# Patient Record
Sex: Female | Born: 1955 | Race: White | Hispanic: No | Marital: Married | Smoking: Never smoker
Health system: Southern US, Community
[De-identification: ages and names within clinical notes are randomized; demographics above are authoritative.]

## PROBLEM LIST (undated history)

## (undated) DIAGNOSIS — N200 Calculus of kidney: Secondary | ICD-10-CM

## (undated) DIAGNOSIS — M1990 Unspecified osteoarthritis, unspecified site: Secondary | ICD-10-CM

## (undated) DIAGNOSIS — E049 Nontoxic goiter, unspecified: Secondary | ICD-10-CM

## (undated) DIAGNOSIS — F419 Anxiety disorder, unspecified: Secondary | ICD-10-CM

## (undated) DIAGNOSIS — F0390 Unspecified dementia without behavioral disturbance: Secondary | ICD-10-CM

## (undated) DIAGNOSIS — Z87898 Personal history of other specified conditions: Secondary | ICD-10-CM

## (undated) DIAGNOSIS — E7800 Pure hypercholesterolemia, unspecified: Secondary | ICD-10-CM

## (undated) DIAGNOSIS — R079 Chest pain, unspecified: Secondary | ICD-10-CM

## (undated) DIAGNOSIS — K219 Gastro-esophageal reflux disease without esophagitis: Secondary | ICD-10-CM

## (undated) DIAGNOSIS — Z6823 Body mass index (BMI) 23.0-23.9, adult: Secondary | ICD-10-CM

## (undated) DIAGNOSIS — R9431 Abnormal electrocardiogram [ECG] [EKG]: Secondary | ICD-10-CM

## (undated) HISTORY — DX: Chest pain, unspecified: R07.9

## (undated) HISTORY — DX: Unspecified osteoarthritis, unspecified site: M19.90

## (undated) HISTORY — PX: BREAST LUMPECTOMY: SHX2

## (undated) HISTORY — DX: Body mass index (BMI) 23.0-23.9, adult: Z68.23

## (undated) HISTORY — PX: TOTAL ABDOMINAL HYSTERECTOMY: SHX209

## (undated) HISTORY — DX: Gilbert syndrome: E80.4

## (undated) HISTORY — PX: UMBILICAL HERNIA REPAIR: SHX196

## (undated) HISTORY — DX: Pure hypercholesterolemia, unspecified: E78.00

## (undated) HISTORY — DX: Anxiety disorder, unspecified: F41.9

## (undated) HISTORY — DX: Gastro-esophageal reflux disease without esophagitis: K21.9

## (undated) HISTORY — DX: Unspecified dementia, unspecified severity, without behavioral disturbance, psychotic disturbance, mood disturbance, and anxiety: F03.90

## (undated) HISTORY — DX: Calculus of kidney: N20.0

## (undated) HISTORY — DX: Abnormal electrocardiogram (ECG) (EKG): R94.31

## (undated) HISTORY — DX: Nontoxic goiter, unspecified: E04.9

## (undated) HISTORY — PX: INGUINAL HERNIA REPAIR: SUR1180

## (undated) HISTORY — DX: Personal history of other specified conditions: Z87.898

## (undated) HISTORY — PX: CHOLECYSTECTOMY: SHX55

## (undated) HISTORY — PX: OTHER SURGICAL HISTORY: SHX169

## (undated) HISTORY — PX: LITHOTRIPSY: SUR834

---

## 1997-11-20 ENCOUNTER — Other Ambulatory Visit: Admission: RE | Admit: 1997-11-20 | Discharge: 1997-11-20 | Payer: Self-pay | Admitting: *Deleted

## 2005-03-30 ENCOUNTER — Encounter: Admission: RE | Admit: 2005-03-30 | Discharge: 2005-03-30 | Payer: Self-pay | Admitting: Gastroenterology

## 2011-01-11 DIAGNOSIS — H539 Unspecified visual disturbance: Secondary | ICD-10-CM

## 2011-01-11 DIAGNOSIS — R42 Dizziness and giddiness: Secondary | ICD-10-CM | POA: Insufficient documentation

## 2011-01-11 HISTORY — DX: Unspecified visual disturbance: H53.9

## 2011-01-11 HISTORY — DX: Dizziness and giddiness: R42

## 2012-02-15 DIAGNOSIS — G43009 Migraine without aura, not intractable, without status migrainosus: Secondary | ICD-10-CM

## 2012-02-15 DIAGNOSIS — R930 Abnormal findings on diagnostic imaging of skull and head, not elsewhere classified: Secondary | ICD-10-CM | POA: Insufficient documentation

## 2012-02-15 HISTORY — DX: Abnormal findings on diagnostic imaging of skull and head, not elsewhere classified: R93.0

## 2012-02-15 HISTORY — DX: Migraine without aura, not intractable, without status migrainosus: G43.009

## 2015-07-12 DIAGNOSIS — E785 Hyperlipidemia, unspecified: Secondary | ICD-10-CM | POA: Insufficient documentation

## 2015-07-12 DIAGNOSIS — R002 Palpitations: Secondary | ICD-10-CM

## 2015-07-12 DIAGNOSIS — R0789 Other chest pain: Secondary | ICD-10-CM

## 2015-07-12 DIAGNOSIS — R0609 Other forms of dyspnea: Secondary | ICD-10-CM | POA: Insufficient documentation

## 2015-07-12 HISTORY — DX: Hyperlipidemia, unspecified: E78.5

## 2015-07-12 HISTORY — DX: Other chest pain: R07.89

## 2015-07-12 HISTORY — DX: Palpitations: R00.2

## 2015-07-12 HISTORY — DX: Other forms of dyspnea: R06.09

## 2017-12-27 ENCOUNTER — Other Ambulatory Visit: Payer: Self-pay | Admitting: Family

## 2017-12-27 DIAGNOSIS — R1310 Dysphagia, unspecified: Secondary | ICD-10-CM

## 2017-12-30 ENCOUNTER — Ambulatory Visit
Admission: RE | Admit: 2017-12-30 | Discharge: 2017-12-30 | Disposition: A | Discharge status: Home / Self Care | Payer: Managed Care, Other (non HMO) | Source: Ambulatory Visit | Attending: Family | Admitting: Family

## 2017-12-30 ENCOUNTER — Other Ambulatory Visit: Payer: Self-pay | Admitting: Family

## 2017-12-30 DIAGNOSIS — M546 Pain in thoracic spine: Secondary | ICD-10-CM

## 2018-01-13 ENCOUNTER — Other Ambulatory Visit: Payer: Managed Care, Other (non HMO)

## 2018-01-13 ENCOUNTER — Ambulatory Visit
Admission: RE | Admit: 2018-01-13 | Discharge: 2018-01-13 | Disposition: A | Discharge status: Home / Self Care | Payer: Managed Care, Other (non HMO) | Source: Ambulatory Visit | Attending: Family | Admitting: Family

## 2018-01-13 DIAGNOSIS — R1310 Dysphagia, unspecified: Secondary | ICD-10-CM

## 2019-02-22 IMAGING — RF DG ESOPHAGUS
5 series · 14 of 17 positions shown · non-contrast
Comparison: None.

CLINICAL DATA: Dysphagia.

EXAM:
ESOPHOGRAM / BARIUM SWALLOW / BARIUM TABLET STUDY
TECHNIQUE: Combined double contrast and single contrast examination performed
using effervescent crystals, thick barium liquid, and thin barium
liquid. The patient was observed with fluoroscopy swallowing a 13 mm
barium sulphate tablet.
FLUOROSCOPY TIME:  Fluoroscopy Time:  1 minutes 30 seconds
Radiation Exposure Index (if provided by the fluoroscopic device):
16 mGy

[Series 1: sequence · 3 of 30 frames shown (1 of 3)]
[frame 5/30]
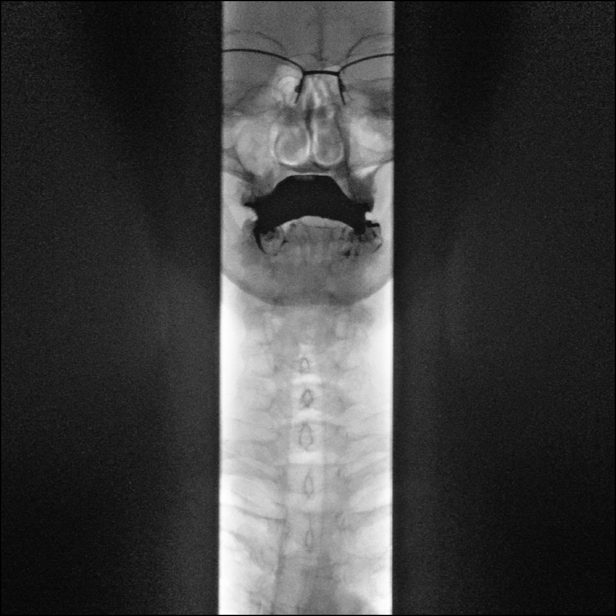
[frame 16/30]
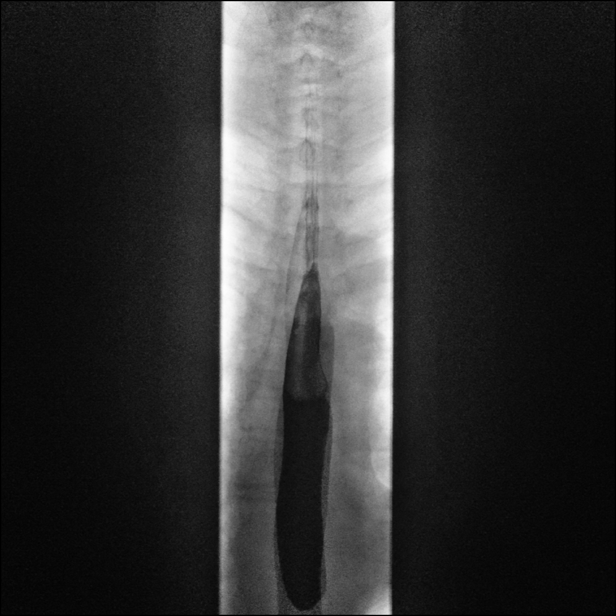
[frame 26/30]
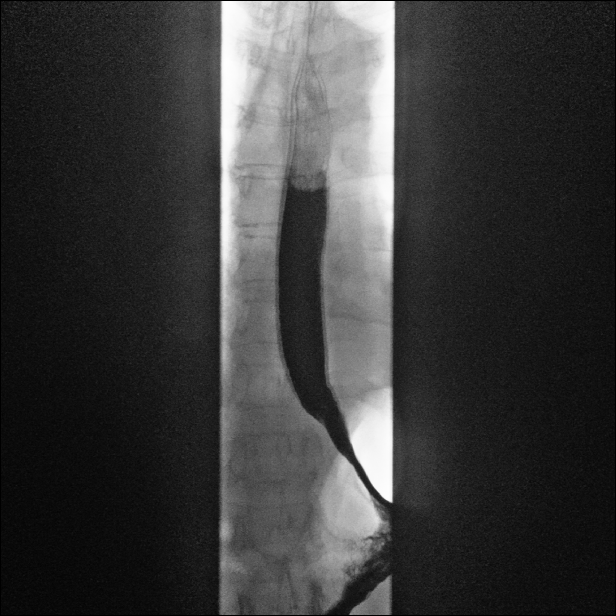

[Series 2: sequence · 4 of 19 frames shown (2 of 3)]
[frame 3/19]
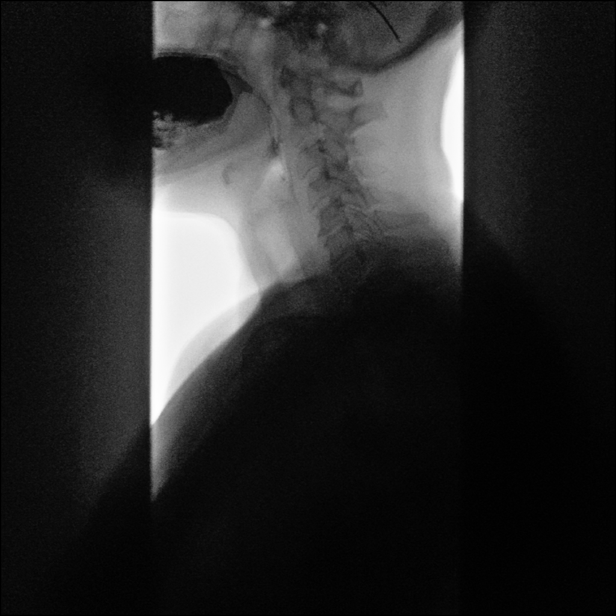
[frame 10/19]
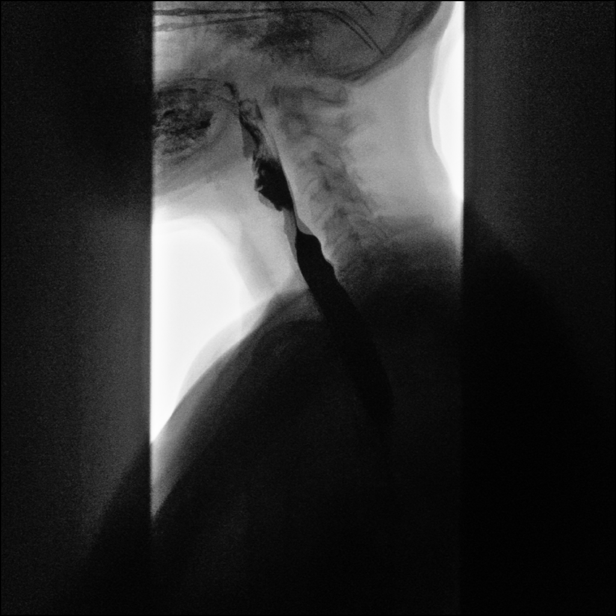
[frame 17/19]
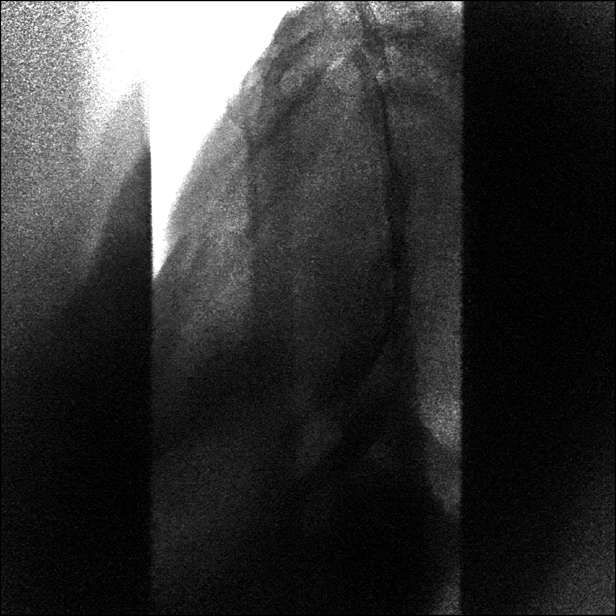
[frame 18/19  full-range]
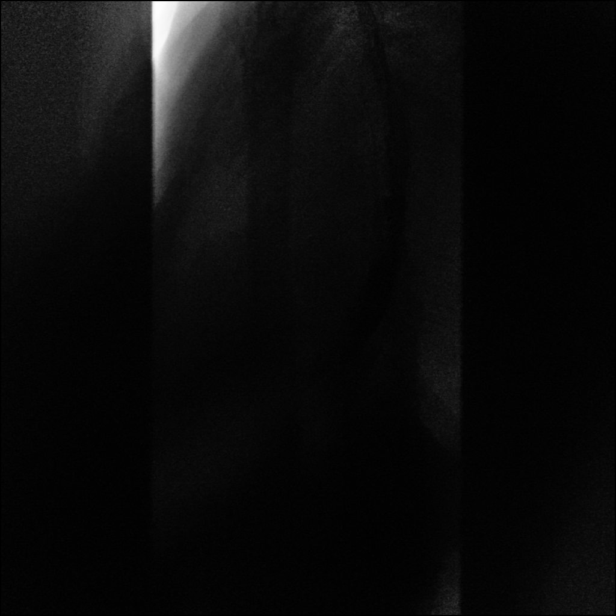

[Series 3: one shot · 0.14mm/px · 3 of 4 slices shown (1 of 2)]
[im 2/4]
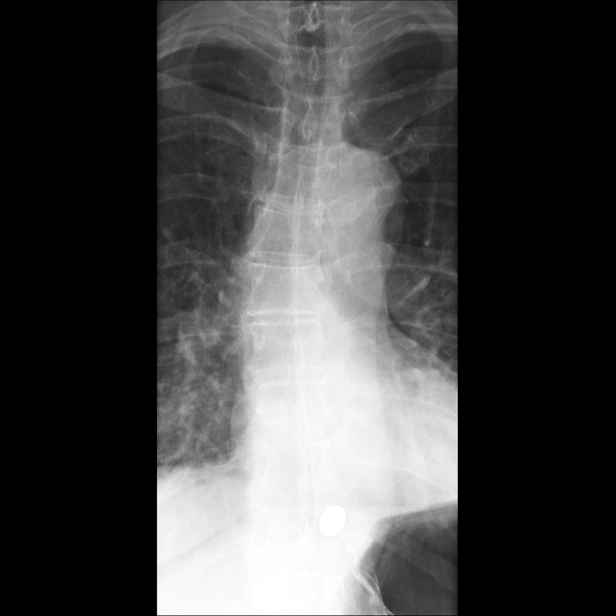
[im 3/4]
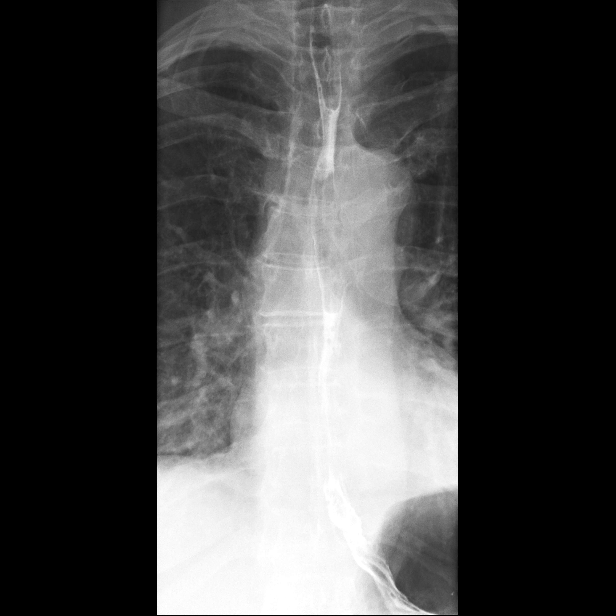
[im 4/4]
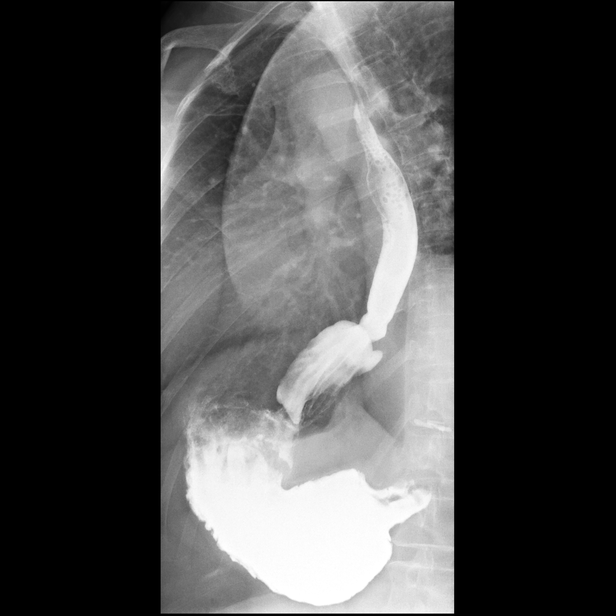

[Series 4: sequence · 3 of 18 frames shown (3 of 3)]
[frame 3/18]
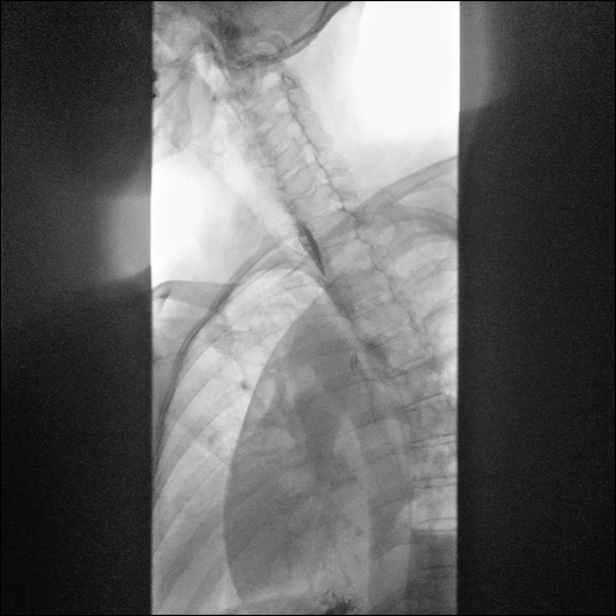
[frame 10/18]
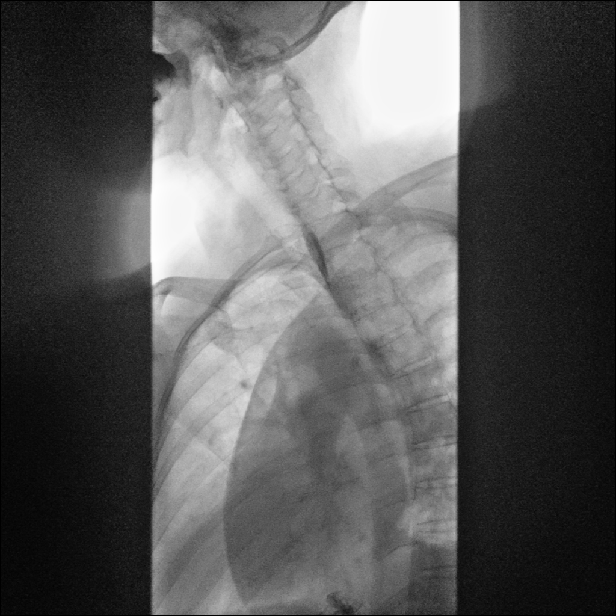
[frame 18/18]
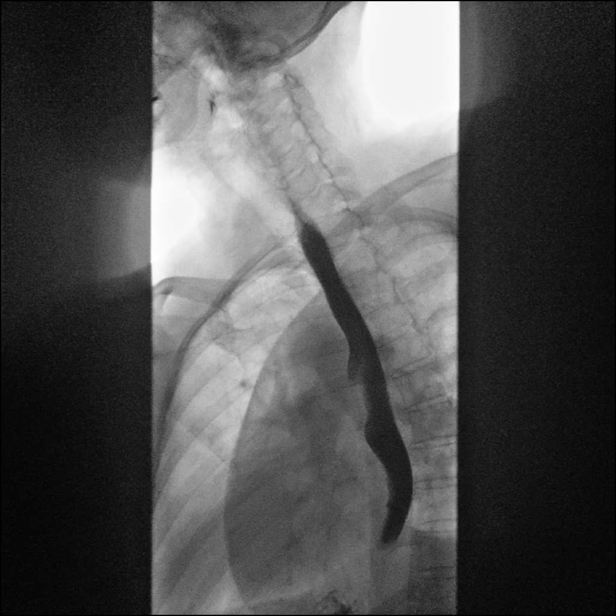

[Series 5: one shot · 0.14mm/px · 1 of 1 slices shown (2 of 2)]
[im 1/1]
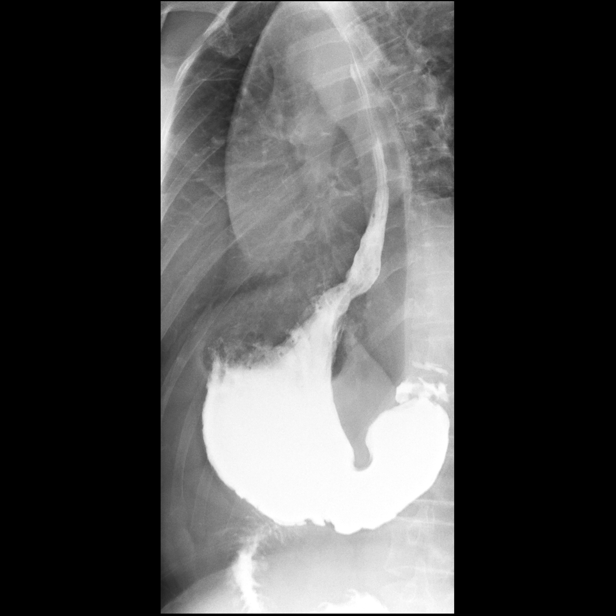

[14 of 17 positions shown; findings below may reference images not displayed]

FINDINGS: The oropharyngeal swallowing mechanisms are normal. The mucosa and
motility of the esophagus are normal. There is a 3 cm hiatal hernia
demonstrated on the prone oblique images.

The 13 mm barium tablet temporarily lodged at the gastroesophageal
junction but did pass into the stomach after repeated swallows of
water and barium.

No mass lesions.  No discrete esophagitis.
IMPRESSION: 3 cm sliding hiatal hernia. Spasm at the gastroesophageal junction
without a stricture. Otherwise, normal exam.

## 2019-12-15 DIAGNOSIS — R079 Chest pain, unspecified: Secondary | ICD-10-CM | POA: Diagnosis not present

## 2019-12-15 DIAGNOSIS — K219 Gastro-esophageal reflux disease without esophagitis: Secondary | ICD-10-CM | POA: Diagnosis not present

## 2019-12-15 DIAGNOSIS — E7800 Pure hypercholesterolemia, unspecified: Secondary | ICD-10-CM | POA: Diagnosis not present

## 2019-12-15 DIAGNOSIS — R0602 Shortness of breath: Secondary | ICD-10-CM | POA: Diagnosis not present

## 2019-12-16 DIAGNOSIS — R079 Chest pain, unspecified: Secondary | ICD-10-CM | POA: Diagnosis not present

## 2019-12-16 DIAGNOSIS — R0602 Shortness of breath: Secondary | ICD-10-CM | POA: Diagnosis not present

## 2019-12-21 DIAGNOSIS — R079 Chest pain, unspecified: Secondary | ICD-10-CM | POA: Diagnosis not present

## 2019-12-21 DIAGNOSIS — R0602 Shortness of breath: Secondary | ICD-10-CM | POA: Diagnosis not present

## 2019-12-21 DIAGNOSIS — E7800 Pure hypercholesterolemia, unspecified: Secondary | ICD-10-CM | POA: Diagnosis not present

## 2021-01-27 ENCOUNTER — Ambulatory Visit (INDEPENDENT_AMBULATORY_CARE_PROVIDER_SITE_OTHER): Payer: 59

## 2021-01-27 ENCOUNTER — Other Ambulatory Visit: Payer: Self-pay

## 2021-01-27 ENCOUNTER — Ambulatory Visit (INDEPENDENT_AMBULATORY_CARE_PROVIDER_SITE_OTHER): Payer: 59 | Admitting: Sports Medicine

## 2021-01-27 ENCOUNTER — Encounter: Payer: Self-pay | Admitting: Sports Medicine

## 2021-01-27 DIAGNOSIS — M7750 Other enthesopathy of unspecified foot: Secondary | ICD-10-CM

## 2021-01-27 DIAGNOSIS — M79671 Pain in right foot: Secondary | ICD-10-CM | POA: Diagnosis not present

## 2021-01-27 DIAGNOSIS — M7751 Other enthesopathy of right foot: Secondary | ICD-10-CM

## 2021-01-27 DIAGNOSIS — L603 Nail dystrophy: Secondary | ICD-10-CM | POA: Diagnosis not present

## 2021-01-27 MED ORDER — PREDNISONE 10 MG (21) PO TBPK: ORAL_TABLET | ORAL | 0 refills | Status: DC

## 2021-01-27 NOTE — Progress Notes (Signed)
Subjective: Samantha Mann is a 65 y.o. female patient who presents to office for evaluation of Right heel pain.  Patient reports that pain has slowly worsened since her beach trip 2 weeks ago states that it was very swollen on Saturday and states that she has not tried any treatment.  Patient also states that she is concerned about the discoloration at the tip of her right great toenail.  Patient denies any other pedal complaints at this time.  Patient is assisted by daughter this visit.  Patient Active Problem List   Diagnosis Date Noted   Atypical chest pain 07/12/2015   Dyslipidemia 07/12/2015   Dyspnea on exertion 07/12/2015   Palpitations 07/12/2015   Migraine without aura 02/15/2012   Nonspecific abnormal findings on radiological and examination of skull and head 02/15/2012   Dizziness 01/11/2011   Visual disturbances 01/11/2011    Current Outpatient Medications on File Prior to Visit  Medication Sig Dispense Refill   ALPRAZolam (XANAX) 0.25 MG tablet as needed.     baclofen (LIORESAL) 10 MG tablet TK 1 T PO  BID PRN P     divalproex (DEPAKOTE ER) 500 MG 24 hr tablet Take 1 tablet by mouth daily.     pravastatin (PRAVACHOL) 20 MG tablet TK 1 T PO QHS     aspirin 81 MG EC tablet 81 mg.     Cholecalciferol 25 MCG (1000 UT) capsule Take by mouth.     estrogen-methylTESTOSTERone 0.625-1.25 MG tablet Frequency:daily   Dosage:0.625-1.25   MG  Instructions:EEMT HS (0.625-1.25MG  TABS, 1 Oral daily)  Note:     Infant Care Products (DERMACLOUD) OINT Apply topically as directed.     PARoxetine (PAXIL) 10 MG tablet Take 10 mg by mouth daily.     ranitidine (ZANTAC) 300 MG capsule Take by mouth.     Vitamin E 45 MG (100 UNIT) CAPS Take by mouth.     No current facility-administered medications on file prior to visit.    Allergies  Allergen Reactions   Acetaminophen Nausea And Vomiting   Oxycodone-Acetaminophen Nausea And Vomiting    Feels like she is going crazy     Hydrocodone-Acetaminophen Anxiety    From Vicodin   Penicillins Rash    Objective:  General: Alert and oriented x3 in no acute distress  Dermatology: No open lesions bilateral lower extremities, no webspace macerations, no ecchymosis bilateral, all nails x 10 are well manicured however there is significant thickness and subungual debris at the distal aspect of the right hallux nail.  Vascular: Dorsalis Pedis and Posterior Tibial pedal pulses 1/4, Capillary Fill Time 3 seconds, + pedal hair growth bilateral, no edema bilateral lower extremities, Temperature gradient within normal limits.  Neurology: Michaell Cowing sensation intact via light touch bilateral.  Musculoskeletal: Mild tenderness with palpation at insertion of the Achilles on Right, there is calcaneal exostosis with mild soft tissue present and decreased ankle rom with knee extending  vs flexed resembling gastroc equnius bilateral, The achilles tendon feels intact with no nodularity or palpable dell, Thompson sign negative, Subtalar and midtarsal joint range of motion is within normal limits, there is limited first MPJ range of motion right greater than left.  Gait: Antalgic gait with increased heel off right right foot  Xrays  Right foot   Impression: Normal osseous mineralization. Joint spaces preserved. No fracture/dislocation/boney destruction. Calcaneal spur present. Kager's triangle intact with no obliteration. No soft tissue abnormalities or radiopaque foreign bodies.   Assessment and Plan: Problem List Items Addressed This Visit  None Visit Diagnoses     Tendonitis of ankle or foot    -  Primary   Relevant Orders   DG Foot Complete Right   Pain of right heel       Nail dystrophy       Relevant Orders   Culture, fungus without smear       -Complete examination performed -Xrays reviewed -Discussed treatement options for Achilles tendinitis on right -Dispensed cam boot and advised patient to wear as  directed -Dispensed Surgigrip compression sleeve to assist with edema control at the posterior heel -Advised patient to ice for 20 minutes once or twice daily -Rx prednisone for patient to take as directed -Fungal culture was obtained by removing a portion of the hard nail of the right great toenail using a sterile nail nipper and sent to [facility] lab. Patient tolerated the biopsy procedure well without discomfort or need for anesthesia.  -Patient to return to office 3 to 4 weeks or sooner if condition worsens.  Asencion Islam, DPM

## 2021-02-21 ENCOUNTER — Encounter: Payer: Self-pay | Admitting: Sports Medicine

## 2021-02-21 ENCOUNTER — Ambulatory Visit (INDEPENDENT_AMBULATORY_CARE_PROVIDER_SITE_OTHER): Payer: 59 | Admitting: Sports Medicine

## 2021-02-21 ENCOUNTER — Other Ambulatory Visit: Payer: Self-pay

## 2021-02-21 DIAGNOSIS — M7661 Achilles tendinitis, right leg: Secondary | ICD-10-CM | POA: Diagnosis not present

## 2021-02-21 DIAGNOSIS — M79671 Pain in right foot: Secondary | ICD-10-CM

## 2021-02-21 DIAGNOSIS — L603 Nail dystrophy: Secondary | ICD-10-CM

## 2021-02-21 NOTE — Progress Notes (Signed)
Subjective: Samantha Mann is a 65 y.o. female patient who returns to office for follow-up evaluation of right heel pain.  Patient reports that the pain flares up off and on but is a lot better 2 out of 10 with some swelling currently using boot and compression sleeve.  Patient is also here for discussion of fungal culture results of the right great toenail.  Patient is assisted by daughter this visit.  Patient Active Problem List   Diagnosis Date Noted   Atypical chest pain 07/12/2015   Dyslipidemia 07/12/2015   Dyspnea on exertion 07/12/2015   Palpitations 07/12/2015   Migraine without aura 02/15/2012   Nonspecific abnormal findings on radiological and examination of skull and head 02/15/2012   Dizziness 01/11/2011   Visual disturbances 01/11/2011    Current Outpatient Medications on File Prior to Visit  Medication Sig Dispense Refill   ALPRAZolam (XANAX) 0.25 MG tablet as needed.     aspirin 81 MG EC tablet 81 mg.     baclofen (LIORESAL) 10 MG tablet TK 1 T PO  BID PRN P     Cholecalciferol 25 MCG (1000 UT) capsule Take by mouth.     divalproex (DEPAKOTE ER) 500 MG 24 hr tablet Take 1 tablet by mouth daily.     estrogen-methylTESTOSTERone 0.625-1.25 MG tablet Frequency:daily   Dosage:0.625-1.25   MG  Instructions:EEMT HS (0.625-1.25MG  TABS, 1 Oral daily)  Note:     Infant Care Products (DERMACLOUD) OINT Apply topically as directed.     PARoxetine (PAXIL) 10 MG tablet Take 10 mg by mouth daily.     pravastatin (PRAVACHOL) 20 MG tablet TK 1 T PO QHS     predniSONE (STERAPRED UNI-PAK 21 TAB) 10 MG (21) TBPK tablet Take as directed 21 tablet 0   ranitidine (ZANTAC) 300 MG capsule Take by mouth.     Vitamin E 45 MG (100 UNIT) CAPS Take by mouth.     No current facility-administered medications on file prior to visit.    Allergies  Allergen Reactions   Acetaminophen Nausea And Vomiting   Oxycodone-Acetaminophen Nausea And Vomiting    Feels like she is going crazy     Hydrocodone-Acetaminophen Anxiety    From Vicodin   Penicillins Rash    Objective:  General: Alert and oriented x3 in no acute distress  Dermatology: No open lesions bilateral lower extremities, no webspace macerations, no ecchymosis bilateral, all nails x 10 are well manicured however there is significant thickness and subungual debris at the distal aspect of the right hallux nail unchanged from prior.  Vascular: Dorsalis Pedis and Posterior Tibial pedal pulses 1/4, Capillary Fill Time 3 seconds, + pedal hair growth bilateral, no edema bilateral lower extremities, Temperature gradient within normal limits.  Neurology: Michaell Cowing sensation intact via light touch bilateral.  Musculoskeletal: Mild tenderness with palpation at insertion of the Achilles on Right, there is calcaneal exostosis with mild soft tissue present and decreased ankle rom with knee extending  vs flexed resembling gastroc equnius bilateral, The achilles tendon feels intact with no nodularity or palpable dell, Thompson sign negative, Subtalar and midtarsal joint range of motion is within normal limits, there is limited first MPJ range of motion right greater than left unchanged from prior.   Assessment and Plan: Problem List Items Addressed This Visit   None Visit Diagnoses     Achilles tendinitis, right leg    -  Primary   Pain of right heel       Nail dystrophy           -  Complete examination performed -Re-Discussed treatement options for Achilles tendinitis on right -Advised patient to continue with cam boot for 1 more week after 1 week may transition to a open back shoe or a shoe with a heel lift that does not rub  -Continue with icing -Dispensed night splint for patient to use as directed -Discussed with patient treatment options for nail dystrophy negative for fungus changes are consistent with microtrauma -Patient elects at this time to try Eucerin cream after bath or shower to the nail and I advised patient and  daughter that if this does not improve the nail appearance after consistent use every night for the next 3 months then to call office for prescription for topical urea -Patient to return to office 3 to 4 weeks or sooner if condition worsens.  Advised patient if Achilles is still hurting the next step would be to get an advanced image like an ultrasound or an MRI to check for partial tearing  Asencion Islam, DPM

## 2021-03-15 ENCOUNTER — Ambulatory Visit (INDEPENDENT_AMBULATORY_CARE_PROVIDER_SITE_OTHER): Payer: 59 | Admitting: Sports Medicine

## 2021-03-15 DIAGNOSIS — M7661 Achilles tendinitis, right leg: Secondary | ICD-10-CM | POA: Diagnosis not present

## 2021-03-15 DIAGNOSIS — M79671 Pain in right foot: Secondary | ICD-10-CM | POA: Diagnosis not present

## 2021-03-15 NOTE — Patient Instructions (Signed)
Eucerin Cream use it daily to your nail after bath or shower. May use a nail file as needed 2-3x per week prior applying cream

## 2021-03-15 NOTE — Progress Notes (Signed)
Subjective: Samantha Mann is a 65 y.o. female patient who returns to office for follow-up evaluation of right heel pain.  Patient reports that she is doing better, pain now 1-2/10 but otherwise doing good.    Patient Active Problem List   Diagnosis Date Noted   Atypical chest pain 07/12/2015   Dyslipidemia 07/12/2015   Dyspnea on exertion 07/12/2015   Palpitations 07/12/2015   Migraine without aura 02/15/2012   Nonspecific abnormal findings on radiological and examination of skull and head 02/15/2012   Dizziness 01/11/2011   Visual disturbances 01/11/2011    Current Outpatient Medications on File Prior to Visit  Medication Sig Dispense Refill   ALPRAZolam (XANAX) 0.25 MG tablet as needed.     aspirin 81 MG EC tablet 81 mg.     baclofen (LIORESAL) 10 MG tablet TK 1 T PO  BID PRN P     Cholecalciferol 25 MCG (1000 UT) capsule Take by mouth.     divalproex (DEPAKOTE ER) 500 MG 24 hr tablet Take 1 tablet by mouth daily.     estrogen-methylTESTOSTERone 0.625-1.25 MG tablet Frequency:daily   Dosage:0.625-1.25   MG  Instructions:EEMT HS (0.625-1.25MG  TABS, 1 Oral daily)  Note:     Infant Care Products (DERMACLOUD) OINT Apply topically as directed.     PARoxetine (PAXIL) 10 MG tablet Take 10 mg by mouth daily.     pravastatin (PRAVACHOL) 20 MG tablet TK 1 T PO QHS     predniSONE (STERAPRED UNI-PAK 21 TAB) 10 MG (21) TBPK tablet Take as directed 21 tablet 0   ranitidine (ZANTAC) 300 MG capsule Take by mouth.     Vitamin E 45 MG (100 UNIT) CAPS Take by mouth.     No current facility-administered medications on file prior to visit.    Allergies  Allergen Reactions   Acetaminophen Nausea And Vomiting   Oxycodone-Acetaminophen Nausea And Vomiting    Feels like she is going crazy    Hydrocodone-Acetaminophen Anxiety    From Vicodin   Penicillins Rash    Objective:  General: Alert and oriented x3 in no acute distress  Dermatology: No open lesions bilateral lower extremities, no  webspace macerations, no ecchymosis bilateral, all nails x 10 are well manicured however there is significant thickness and subungual debris at the distal aspect of the right hallux nail unchanged from prior. Bandaid present over the toe.   Vascular: Dorsalis Pedis and Posterior Tibial pedal pulses 1/4, Capillary Fill Time 3 seconds, + pedal hair growth bilateral, no edema bilateral lower extremities, Temperature gradient within normal limits.  Neurology: Michaell Cowing sensation intact via light touch bilateral.  Musculoskeletal: Minimal tenderness with palpation at insertion of the Achilles on Right, there is minimal calcaneal exostosis with mild soft tissue present and decreased ankle rom with knee extending  vs flexed resembling gastroc equnius bilateral, The achilles tendon feels intact with no nodularity or palpable dell, Thompson sign negative, Subtalar and midtarsal joint range of motion is within normal limits, there is limited first MPJ range of motion right greater than left unchanged from prior.   Assessment and Plan: Problem List Items Addressed This Visit   None Visit Diagnoses     Achilles tendinitis, right leg    -  Primary   Pain of right heel           -Complete examination performed -Re-Discussed treatement options for Achilles tendinitis on right -May wean from use of Night splint after 1 month -Continue with good supportive shoes and heel lifts  -  Discussed with patient treatment options for nail dystrophy negative for fungus changes are consistent with microtrauma -Advised her to try Eucerin cream to the nail since they do not want Rx at this time for topical urea -Patient to return to office PRN.   Asencion Islam, DPM

## 2021-04-18 DIAGNOSIS — R159 Full incontinence of feces: Secondary | ICD-10-CM | POA: Diagnosis not present

## 2021-04-18 DIAGNOSIS — E2839 Other primary ovarian failure: Secondary | ICD-10-CM | POA: Diagnosis not present

## 2021-04-18 DIAGNOSIS — Z6823 Body mass index (BMI) 23.0-23.9, adult: Secondary | ICD-10-CM | POA: Diagnosis not present

## 2021-04-18 DIAGNOSIS — Z1331 Encounter for screening for depression: Secondary | ICD-10-CM | POA: Diagnosis not present

## 2021-04-18 DIAGNOSIS — R413 Other amnesia: Secondary | ICD-10-CM | POA: Diagnosis not present

## 2021-04-18 DIAGNOSIS — Z0001 Encounter for general adult medical examination with abnormal findings: Secondary | ICD-10-CM | POA: Diagnosis not present

## 2021-06-07 ENCOUNTER — Encounter: Payer: Self-pay | Admitting: *Deleted

## 2021-06-12 ENCOUNTER — Encounter: Payer: Self-pay | Admitting: Diagnostic Neuroimaging

## 2021-06-12 ENCOUNTER — Ambulatory Visit: Payer: 59 | Admitting: Diagnostic Neuroimaging

## 2021-06-12 VITALS — BP 144/84 | HR 56 | Ht 62.0 in | Wt 128.2 lb

## 2021-06-12 DIAGNOSIS — R413 Other amnesia: Secondary | ICD-10-CM

## 2021-06-12 MED ORDER — MEMANTINE HCL 10 MG PO TABS: 10.0000 mg | ORAL_TABLET | Freq: Two times a day (BID) | ORAL | 12 refills | Status: DC

## 2021-06-12 NOTE — Progress Notes (Signed)
? ?[facility] ? ?PATIENT: Samantha Mann ?DOB: 1955-12-15 ? ?REFERRING CLINICIAN: Rolm Gala, NP ?HISTORY FROM: patient and husband ?REASON FOR VISIT: new consult ? ? ?HISTORICAL ? ?CHIEF COMPLAINT:  ?Chief Complaint  ?Patient presents with  ? Memory Loss  ?  Rm 7 New Pt  husbandAurther Loft MMSE 23  ? ? ?HISTORY OF PRESENT ILLNESS:  ? ?66 year old female here for evaluation of memory loss.  Symptoms started 2 to 3 years ago, but worse in the last 1 to 2 years.  Having short-term memory loss, repeating her self, needing more help with day-to-day tasks, writing down things, to do lists.  Had an episode where she had significant brain fog and got lost driving.  She had to call her husband who had to come help her get home. ? ?Symptoms have been progressive over time.  Also becoming more withdrawn and childlike.  Personality behavior are changing. ? ?Patient's mother had dementia.  Patient lives at home with husband.  She has good support with family, children and grandchildren who live nearby. ? ? ?REVIEW OF SYSTEMS: Full 14 system review of systems performed and negative with exception of: as per HPI. ? ?ALLERGIES: ?Allergies  ?Allergen Reactions  ? Acetaminophen Nausea And Vomiting  ? Oxycodone-Acetaminophen Nausea And Vomiting  ?  Feels like she is going crazy ?  ? Hydrocodone-Acetaminophen Anxiety  ?  From Vicodin  ? Penicillins Rash  ? ? ?HOME MEDICATIONS: ?Outpatient Medications Prior to Visit  ?Medication Sig Dispense Refill  ? Ascorbic Acid (VITAMIN C PO) Take by mouth.    ? aspirin 81 MG EC tablet 81 mg.    ? Cholecalciferol 25 MCG (1000 UT) capsule Take by mouth.    ? Infant Care Products [facility]) OINT Apply topically as directed.    ? pravastatin (PRAVACHOL) 20 MG tablet TK 1 T PO QHS    ? Vitamin E 45 MG (100 UNIT) CAPS Take by mouth.    ? ALPRAZolam (XANAX) 0.25 MG tablet as needed. (Patient not taking: Reported on 06/12/2021)    ? baclofen (LIORESAL) 10 MG tablet TK 1 T PO  BID PRN  P (Patient not taking: Reported on 06/12/2021)    ? divalproex (DEPAKOTE ER) 500 MG 24 hr tablet Take 1 tablet by mouth daily. (Patient not taking: Reported on 06/12/2021)    ? estrogen-methylTESTOSTERone 0.625-1.25 MG tablet Frequency:daily   Dosage:0.625-1.25   MG  Instructions:EEMT HS (0.625-1.25MG  TABS, 1 Oral daily)  Note: (Patient not taking: Reported on 06/12/2021)    ? PARoxetine (PAXIL) 10 MG tablet Take 10 mg by mouth daily. (Patient not taking: Reported on 06/12/2021)    ? predniSONE (STERAPRED UNI-PAK 21 TAB) 10 MG (21) TBPK tablet Take as directed 21 tablet 0  ? ranitidine (ZANTAC) 300 MG capsule Take by mouth. (Patient not taking: Reported on 06/12/2021)    ? ?No facility-administered medications prior to visit.  ? ? ?PAST MEDICAL HISTORY: ?Past Medical History:  ?Diagnosis Date  ? Anxiety   ? Arthritis   ? GERD (gastroesophageal reflux disease)   ? Gilbert's disease   ? Goiter   ? H/O: pituitary tumor   ? High cholesterol   ? Kidney stones   ? ? ?PAST SURGICAL HISTORY: ?Past Surgical History:  ?Procedure Laterality Date  ? BREAST LUMPECTOMY Right   ? benign  ? CHOLECYSTECTOMY    ? INGUINAL HERNIA REPAIR Right   ? LITHOTRIPSY    ? x3  ? thyroid nodule    ? excision  ?  TOTAL ABDOMINAL HYSTERECTOMY    ? UMBILICAL HERNIA REPAIR    ? x2  ? ? ?FAMILY HISTORY: ?Family History  ?Problem Relation Age of Onset  ? Stroke Mother   ? Cancer Father   ? Lupus Brother   ? ? ?SOCIAL HISTORY: ?Social History  ? ?Socioeconomic History  ? Marital status: Married  ?  Spouse name: Aurther Lofterry  ? Number of children: 3  ? Years of education: 2012  ? Highest education level: Not on file  ?Occupational History  ? Not on file  ?Tobacco Use  ? Smoking status: Never  ? Smokeless tobacco: Never  ?Substance and Sexual Activity  ? Alcohol use: Not Currently  ? Drug use: Never  ? Sexual activity: Not on file  ?Other Topics Concern  ? Not on file  ?Social History Narrative  ? 06/12/21 lives with husband  ? ?Social Determinants of Health   ? ?Financial Resource Strain: Not on file  ?Food Insecurity: Not on file  ?Transportation Needs: Not on file  ?Physical Activity: Not on file  ?Stress: Not on file  ?Social Connections: Not on file  ?Intimate Partner Violence: Not on file  ? ? ? ?PHYSICAL EXAM ? ?GENERAL EXAM/CONSTITUTIONAL: ?Vitals:  ?Vitals:  ? 06/12/21 1127  ?BP: (!) 144/84  ?Pulse: (!) 56  ?Weight: 128 lb 3.2 oz (58.2 kg)  ?Height: 5\' 2"  (1.575 m)  ? ?Body mass index is 23.45 kg/m?. ?Wt Readings from Last 3 Encounters:  ?06/12/21 128 lb 3.2 oz (58.2 kg)  ? ?Patient is in no distress; well developed, nourished and groomed; neck is supple ? ?CARDIOVASCULAR: ?Examination of carotid arteries is normal; no carotid bruits ?Regular rate and rhythm, no murmurs ?Examination of peripheral vascular system by observation and palpation is normal ? ?EYES: ?Ophthalmoscopic exam of optic discs and posterior segments is normal; no papilledema or hemorrhages ?No results found. ? ?MUSCULOSKELETAL: ?Gait, strength, tone, movements noted in Neurologic exam below ? ?NEUROLOGIC: ?MENTAL STATUS:  ?MMSE - Mini Mental State Exam 06/12/2021  ?Orientation to time 4  ?Orientation to Place 3  ?Registration 3  ?Attention/ Calculation 4  ?Recall 1  ?Language- name 2 objects 2  ?Language- repeat 0  ?Language- follow 3 step command 3  ?Language- read & follow direction 1  ?Write a sentence 1  ?Copy design 1  ?Total score 23  ? ?awake, alert, oriented to person, place and time ?recent and remote memory intact ?normal attention and concentration ?language fluent, comprehension intact, naming intact ?fund of knowledge appropriate ? ?CRANIAL NERVE:  ?2nd - no papilledema on fundoscopic exam ?2nd, 3rd, 4th, 6th - pupils equal and reactive to light, visual fields full to confrontation, extraocular muscles intact, no nystagmus ?5th - facial sensation symmetric ?7th - facial strength symmetric ?8th - hearing intact ?9th - palate elevates symmetrically, uvula midline ?11th - shoulder  shrug symmetric ?12th - tongue protrusion midline ? ?MOTOR:  ?normal bulk and tone, full strength in the BUE, BLE ? ?SENSORY:  ?normal and symmetric to light touch, pinprick, temperature, vibration ? ?COORDINATION:  ?finger-nose-finger, fine finger movements normal ? ?REFLEXES:  ?deep tendon reflexes present and symmetric ? ?GAIT/STATION:  ?narrow based gait ? ? ? ? ?DIAGNOSTIC DATA (LABS, IMAGING, TESTING) ?- I reviewed patient records, labs, notes, testing and imaging myself where available. ? ?No results found for: WBC, HGB, HCT, MCV, PLT ?No results found for: NA, K, CL, CO2, GLUCOSE, BUN, CREATININE, CALCIUM, PROT, ALBUMIN, AST, ALT, ALKPHOS, BILITOT, GFRNONAA, GFRAA ?No results found for: CHOL, HDL,  LDLCALC, LDLDIRECT, TRIG, CHOLHDL ?No results found for: HGBA1C ?No results found for: VITAMINB12 ?No results found for: TSH ? ? ?06/02/19 MRI brain [I reviewed images myself and agree with interpretation. Stable since 2015. -[facility]]  ?1. 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 ?2. Otherwise unremarkable appearance of the brain for age.  ? ? ? ?ASSESSMENT AND PLAN ? ?66 y.o. year old female here with:  ? ?Dx: ? ?1. Memory loss   ? ? ? ?PLAN: ? 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 ?Meds ordered this encounter  ?Medications  ? memantine (NAMENDA) 10 MG tablet  ?  Sig: Take 1 tablet (10 mg total) by mouth 2 (two) times daily.  ?  Dispense:  60 tablet  ?  Refill:  12  ? ?Return for pending if  symptoms worsen or fail to improve. ? ? ? ? , MD 06/12/2021, 12:08 PM ?Certified in Neurology, Neurophysiology and Neuroimaging ? ?[facility] ?[address], [address] ?06/14/2021

## 2021-06-12 NOTE — Patient Instructions (Signed)
?  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

## 2021-11-02 DIAGNOSIS — R070 Pain in throat: Secondary | ICD-10-CM | POA: Diagnosis not present

## 2021-11-02 DIAGNOSIS — R051 Acute cough: Secondary | ICD-10-CM | POA: Diagnosis not present

## 2021-11-02 DIAGNOSIS — R0981 Nasal congestion: Secondary | ICD-10-CM | POA: Diagnosis not present

## 2021-12-19 DIAGNOSIS — R0683 Snoring: Secondary | ICD-10-CM | POA: Diagnosis not present

## 2021-12-19 DIAGNOSIS — R5383 Other fatigue: Secondary | ICD-10-CM | POA: Diagnosis not present

## 2021-12-19 DIAGNOSIS — Z79899 Other long term (current) drug therapy: Secondary | ICD-10-CM | POA: Diagnosis not present

## 2021-12-19 DIAGNOSIS — K219 Gastro-esophageal reflux disease without esophagitis: Secondary | ICD-10-CM | POA: Diagnosis not present

## 2021-12-19 DIAGNOSIS — R350 Frequency of micturition: Secondary | ICD-10-CM | POA: Diagnosis not present

## 2021-12-19 DIAGNOSIS — R69 Illness, unspecified: Secondary | ICD-10-CM | POA: Diagnosis not present

## 2021-12-19 DIAGNOSIS — E7800 Pure hypercholesterolemia, unspecified: Secondary | ICD-10-CM | POA: Diagnosis not present

## 2021-12-19 DIAGNOSIS — Z6823 Body mass index (BMI) 23.0-23.9, adult: Secondary | ICD-10-CM | POA: Diagnosis not present

## 2022-01-18 DIAGNOSIS — J0110 Acute frontal sinusitis, unspecified: Secondary | ICD-10-CM | POA: Diagnosis not present

## 2022-01-18 DIAGNOSIS — R051 Acute cough: Secondary | ICD-10-CM | POA: Diagnosis not present

## 2022-01-18 DIAGNOSIS — R0981 Nasal congestion: Secondary | ICD-10-CM | POA: Diagnosis not present

## 2022-03-01 DIAGNOSIS — R0602 Shortness of breath: Secondary | ICD-10-CM | POA: Diagnosis not present

## 2022-03-01 DIAGNOSIS — F419 Anxiety disorder, unspecified: Secondary | ICD-10-CM | POA: Diagnosis not present

## 2022-03-01 DIAGNOSIS — Z1331 Encounter for screening for depression: Secondary | ICD-10-CM | POA: Diagnosis not present

## 2022-03-01 DIAGNOSIS — Z0000 Encounter for general adult medical examination without abnormal findings: Secondary | ICD-10-CM | POA: Diagnosis not present

## 2022-03-01 DIAGNOSIS — F03A0 Unspecified dementia, mild, without behavioral disturbance, psychotic disturbance, mood disturbance, and anxiety: Secondary | ICD-10-CM | POA: Diagnosis not present

## 2022-03-01 DIAGNOSIS — E7800 Pure hypercholesterolemia, unspecified: Secondary | ICD-10-CM | POA: Diagnosis not present

## 2022-03-01 DIAGNOSIS — R079 Chest pain, unspecified: Secondary | ICD-10-CM | POA: Diagnosis not present

## 2022-03-01 DIAGNOSIS — K219 Gastro-esophageal reflux disease without esophagitis: Secondary | ICD-10-CM | POA: Diagnosis not present

## 2022-03-01 DIAGNOSIS — Z131 Encounter for screening for diabetes mellitus: Secondary | ICD-10-CM | POA: Diagnosis not present

## 2022-03-01 DIAGNOSIS — Z1159 Encounter for screening for other viral diseases: Secondary | ICD-10-CM | POA: Diagnosis not present

## 2022-03-07 ENCOUNTER — Other Ambulatory Visit: Payer: Self-pay

## 2022-03-07 DIAGNOSIS — R9431 Abnormal electrocardiogram [ECG] [EKG]: Secondary | ICD-10-CM | POA: Insufficient documentation

## 2022-03-07 DIAGNOSIS — F0390 Unspecified dementia without behavioral disturbance: Secondary | ICD-10-CM | POA: Insufficient documentation

## 2022-03-07 DIAGNOSIS — F419 Anxiety disorder, unspecified: Secondary | ICD-10-CM | POA: Insufficient documentation

## 2022-03-07 DIAGNOSIS — M1990 Unspecified osteoarthritis, unspecified site: Secondary | ICD-10-CM | POA: Insufficient documentation

## 2022-03-07 DIAGNOSIS — R079 Chest pain, unspecified: Secondary | ICD-10-CM | POA: Insufficient documentation

## 2022-03-07 DIAGNOSIS — E7800 Pure hypercholesterolemia, unspecified: Secondary | ICD-10-CM | POA: Insufficient documentation

## 2022-03-07 DIAGNOSIS — Z87898 Personal history of other specified conditions: Secondary | ICD-10-CM | POA: Insufficient documentation

## 2022-03-07 DIAGNOSIS — Z6823 Body mass index (BMI) 23.0-23.9, adult: Secondary | ICD-10-CM | POA: Insufficient documentation

## 2022-03-07 DIAGNOSIS — K219 Gastro-esophageal reflux disease without esophagitis: Secondary | ICD-10-CM | POA: Insufficient documentation

## 2022-03-07 DIAGNOSIS — E049 Nontoxic goiter, unspecified: Secondary | ICD-10-CM | POA: Insufficient documentation

## 2022-03-07 DIAGNOSIS — N200 Calculus of kidney: Secondary | ICD-10-CM | POA: Insufficient documentation

## 2022-03-08 ENCOUNTER — Ambulatory Visit: Payer: Medicare HMO | Attending: Cardiology | Admitting: Cardiology

## 2022-03-08 ENCOUNTER — Encounter: Payer: Self-pay | Admitting: Cardiology

## 2022-03-08 VITALS — BP 120/68 | HR 72 | Ht 62.0 in | Wt 127.0 lb

## 2022-03-08 DIAGNOSIS — I209 Angina pectoris, unspecified: Secondary | ICD-10-CM | POA: Diagnosis not present

## 2022-03-08 DIAGNOSIS — E785 Hyperlipidemia, unspecified: Secondary | ICD-10-CM | POA: Diagnosis not present

## 2022-03-08 DIAGNOSIS — I259 Chronic ischemic heart disease, unspecified: Secondary | ICD-10-CM | POA: Diagnosis not present

## 2022-03-08 DIAGNOSIS — R0609 Other forms of dyspnea: Secondary | ICD-10-CM

## 2022-03-08 HISTORY — DX: Angina pectoris, unspecified: I20.9

## 2022-03-08 MED ORDER — METOPROLOL TARTRATE 100 MG PO TABS: 100.0000 mg | ORAL_TABLET | Freq: Once | ORAL | 0 refills | Status: DC

## 2022-03-08 NOTE — Progress Notes (Signed)
Cardiology Office Note:    Date:  03/08/2022   ID:  Samantha Mann, DOB 12-Jul-1955, MRN [id number]  PCP:  Rolm Gala, NP  Cardiologist:  Garwin Brothers, MD   Referring MD: Rolm Gala, NP    ASSESSMENT:    1. Dyslipidemia   2. Angina pectoris (HCC)   3. Dyspnea on exertion    PLAN:    In order of problems listed above:  Angina pectoris: Patient's symptoms are concerning.  She is on aspirin and as needed nitroglycerin.  In view of the symptoms I discussed CT coronary angiography with FFR and conventional coronary angiography.  Benefits and potential risks of both these procedures discussed with her at length and questions were answered to her satisfaction.  She wants to undergo CT scanning.  She knows to go to the nearest emergency room if there is any concerning symptoms. Mixed dyslipidemia: On lipid-lowering medications.  Diet emphasized.  This is managed by primary care. Patient and daughter had multiple questions and they were answered to her satisfaction.Patient will be seen in follow-up appointment in 3 months or earlier if the patient has any concerns    Medication Adjustments/Labs and Tests Ordered: Current medicines are reviewed at length with the patient today.  Concerns regarding medicines are outlined above.  No orders of the defined types were placed in this encounter.  No orders of the defined types were placed in this encounter.    History of Present Illness:    Samantha Mann is a 66 y.o. female who is being seen today for the evaluation of chest tightness on exertion at the request of Rolm Gala, NP.  Patient is a pleasant 66 year old female.  She has past medical history of mixed dyslipidemia.  She is complaining of chest tightness on exertion.  Sometimes it goes to the neck.  No orthopnea or PND.  This is of concern to her.  Her doctor referred her here for this reason.  She has used sublingual nitroglycerin with relief of the symptoms.  At the time  of my evaluation, the patient is alert awake oriented and in no distress.  Past Medical History:  Diagnosis Date   Abnormal EKG    Anxiety    Arthritis    Atypical chest pain 07/12/2015   BMI 23.0-23.9, adult    Chest pain    Dementia (HCC)    Dizziness 01/11/2011   Dyslipidemia 07/12/2015   Dyspnea on exertion 07/12/2015   GERD (gastroesophageal reflux disease)    Gilbert's disease    Goiter    H/O: pituitary tumor    High cholesterol    Kidney stones    Migraine without aura 02/15/2012   Nonspecific abnormal findings on radiological and examination of skull and head 02/15/2012   Osteoarthritis    Palpitations 07/12/2015   Visual disturbances 01/11/2011    Past Surgical History:  Procedure Laterality Date   BREAST LUMPECTOMY Right    benign   CHOLECYSTECTOMY     INGUINAL HERNIA REPAIR Right    LITHOTRIPSY     x3   thyroid nodule     excision   TOTAL ABDOMINAL HYSTERECTOMY     UMBILICAL HERNIA REPAIR     x2    Current Medications: Current Meds  Medication Sig   aspirin 81 MG EC tablet Take 81 mg by mouth daily.   Cholecalciferol 25 MCG (1000 UT) capsule Take 1,000 Units by mouth daily.   nitroGLYCERIN (NITROSTAT) 0.4 MG SL tablet Place 0.4 mg under the tongue  every 5 (five) minutes as needed for chest pain.   PARoxetine (PAXIL) 10 MG tablet Take 10 mg by mouth daily.   pravastatin (PRAVACHOL) 20 MG tablet Take 20 mg by mouth daily.   Vitamin E 45 MG (100 UNIT) CAPS Take 100 Units by mouth daily.     Allergies:   Oxycodone-acetaminophen, Hydrocodone-acetaminophen, and Penicillins   Social History   Socioeconomic History   Marital status: Married    Spouse name: Coralyn Mark   Number of children: 3   Years of education: 12   Highest education level: Not on file  Occupational History   Not on file  Tobacco Use   Smoking status: Never   Smokeless tobacco: Never  Substance and Sexual Activity   Alcohol use: Not Currently   Drug use: Never   Sexual activity:  Not on file  Other Topics Concern   Not on file  Social History Narrative   06/12/21 lives with husband   Social Determinants of Health   Financial Resource Strain: Not on file  Food Insecurity: Not on file  Transportation Needs: Not on file  Physical Activity: Not on file  Stress: Not on file  Social Connections: Not on file     Family History: The patient's family history includes Cancer in her father; Lupus in her brother; Stroke in her mother.  ROS:   Please see the history of present illness.    All other systems reviewed and are negative.  EKGs/Labs/Other Studies Reviewed:    The following studies were reviewed today: EKG reveals sinus rhythm poor anteroseptal forces and nonspecific ST-T changes   Recent Labs: No results found for requested labs within last 365 days.  Recent Lipid Panel No results found for: "CHOL", "TRIG", "HDL", "CHOLHDL", "VLDL", "LDLCALC", "LDLDIRECT"  Physical Exam:    VS:  BP 120/68   Pulse 72   Ht 5\' 2"  (1.575 m)   Wt 127 lb (57.6 kg)   SpO2 97%   BMI 23.23 kg/m     Wt Readings from Last 3 Encounters:  03/08/22 127 lb (57.6 kg)  06/12/21 128 lb 3.2 oz (58.2 kg)     GEN: Patient is in no acute distress HEENT: Normal NECK: No JVD; No carotid bruits LYMPHATICS: No lymphadenopathy CARDIAC: S1 S2 regular, 2/6 systolic murmur at the apex. RESPIRATORY:  Clear to auscultation without rales, wheezing or rhonchi  ABDOMEN: Soft, non-tender, non-distended MUSCULOSKELETAL:  No edema; No deformity  SKIN: Warm and dry NEUROLOGIC:  Alert and oriented x 3 PSYCHIATRIC:  Normal affect    Signed, Jenean Lindau, MD  03/08/2022 11:24 AM    [place]

## 2022-03-08 NOTE — Patient Instructions (Signed)
Medication Instructions:  Your physician recommends that you continue on your current medications as directed. Please refer to the Current Medication list given to you today.   *If you need a refill on your cardiac medications before your next appointment, please call your pharmacy*   Lab Work: Your physician recommends that you have a BMET today.   If you have labs (blood work) drawn today and your tests are completely normal, you will receive your results only by: MyChart Message (if you have MyChart) OR A paper copy in the mail If you have any lab test that is abnormal or we need to change your treatment, we will call you to review the results.   Testing/Procedures:   Your cardiac CT will be scheduled at one of the below locations:   [facility] [address] [place], [place] [address] [phone]   At [facility], please arrive at the [facility] and Children's Entrance (Entrance C2) of [facility] 30 minutes prior to test start time. You can use the FREE valet parking offered at entrance C (encouraged to control the heart rate for the test)  Proceed to the [facility] Radiology Department (first floor) to check-in and test prep.  All radiology patients and guests should use entrance C2 at [facility], accessed from [facility], even though the hospital's physical address listed is [address]      Please follow these instructions carefully (unless otherwise directed):  Hold all erectile dysfunction medications at least 3 days (72 hrs) prior to test.  On the Night Before the Test: Be sure to Drink plenty of water. Do not consume any caffeinated/decaffeinated beverages or chocolate 12 hours prior to your test. Do not take any antihistamines 12 hours prior to your test.  On the Day of the Test: Drink plenty of water until 1 hour prior to the test. Do not eat any food 4 hours prior to the test. You may take  your regular medications prior to the test.  Take metoprolol (Lopressor) two hours prior to test. FEMALES- please wear underwire-free bra if available, avoid dresses & tight clothing  After the Test: Drink plenty of water. After receiving IV contrast, you may experience a mild flushed feeling. This is normal. On occasion, you may experience a mild rash up to 24 hours after the test. This is not dangerous. If this occurs, you can take Benadryl 25 mg and increase your fluid intake. If you experience trouble breathing, this can be serious. If it is severe call 911 IMMEDIATELY. If it is mild, please call our office. If you take any of these medications: Glipizide/Metformin, Avandament, Glucavance, please do not take 48 hours after completing test unless otherwise instructed.  We will call to schedule your test 2-4 weeks out understanding that some insurance companies will need an authorization prior to the service being performed.   For non-scheduling related questions, please contact the cardiac imaging nurse navigator should you have any questions/concerns: Rockwell Alexandria, Cardiac Imaging Nurse Navigator Larey Brick, Cardiac Imaging Nurse Navigator [facility] Direct Office Dial: [phone]   For scheduling needs, including cancellations and rescheduling, please call [place], [phone].    Follow-Up: At [facility], you and your health needs are our priority.  As part of our continuing mission to provide you with exceptional heart care, we have created designated Provider Care Teams.  These Care Teams include your primary Cardiologist (physician) and Advanced Practice Providers (APPs -  Physician Assistants and Nurse Practitioners) who all work together to provide you with the care you need, when you need it.  We recommend signing up for the patient portal called "MyChart".  Sign up information is provided on this After Visit Summary.  MyChart is used to  connect with patients for Virtual Visits (Telemedicine).  Patients are able to view lab/test results, encounter notes, upcoming appointments, etc.  Non-urgent messages can be sent to your provider as well.   To learn more about what you can do with MyChart, go to [email or web address].    Your next appointment:   6 month(s)  The format for your next appointment:   In Person  Provider:   Belva Crome, MD   Other Instructions Cardiac CT Angiogram A cardiac CT angiogram is a procedure to look at the heart and the area around the heart. It may be done to help find the cause of chest pains or other symptoms of heart disease. During this procedure, a substance called contrast dye is injected into the blood vessels in the area to be checked. A large X-ray machine, called a CT scanner, then takes detailed pictures of the heart and the surrounding area. The procedure is also sometimes called a coronary CT angiogram, coronary artery scanning, or CTA. A cardiac CT angiogram allows the health care provider to see how well blood is flowing to and from the heart. The health care provider will be able to see if there are any problems, such as: Blockage or narrowing of the coronary arteries in the heart. Fluid around the heart. Signs of weakness or disease in the muscles, valves, and tissues of the heart. Tell a health care provider about: Any allergies you have. This is especially important if you have had a previous allergic reaction to contrast dye. All medicines you are taking, including vitamins, herbs, eye drops, creams, and over-the-counter medicines. Any blood disorders you have. Any surgeries you have had. Any medical conditions you have. Whether you are pregnant or may be pregnant. Any anxiety disorders, chronic pain, or other conditions you have that may increase your stress or prevent you from lying still. What are the risks? Generally, this is a safe procedure. However, problems may  occur, including: Bleeding. Infection. Allergic reactions to medicines or dyes. Damage to other structures or organs. Kidney damage from the contrast dye that is used. Increased risk of cancer from radiation exposure. This risk is low. Talk with your health care provider about: The risks and benefits of testing. How you can receive the lowest dose of radiation. What happens before the procedure? Wear comfortable clothing and remove any jewelry, glasses, dentures, and hearing aids. Follow instructions from your health care provider about eating and drinking. This may include: For 12 hours before the procedure -- avoid caffeine. This includes tea, coffee, soda, energy drinks, and diet pills. Drink plenty of water or other fluids that do not have caffeine in them. Being well hydrated can prevent complications. For 4-6 hours before the procedure -- stop eating and drinking. The contrast dye can cause nausea, but this is less likely if your stomach is empty. Ask your health care provider about changing or stopping your regular medicines. This is especially important if you are taking diabetes medicines, blood thinners, or medicines to treat problems with erections (erectile dysfunction). What happens during the procedure?  Hair on your chest may need to be removed so that small sticky patches called electrodes can be placed on your chest. These will  transmit information that helps to monitor your heart during the procedure. An IV will be inserted into one of your veins. You might be given a medicine to control your heart rate during the procedure. This will help to ensure that good images are obtained. You will be asked to lie on an exam table. This table will slide in and out of the CT machine during the procedure. Contrast dye will be injected into the IV. You might feel warm, or you may get a metallic taste in your mouth. You will be given a medicine called nitroglycerin. This will relax or  dilate the arteries in your heart. The table that you are lying on will move into the CT machine tunnel for the scan. The person running the machine will give you instructions while the scans are being done. You may be asked to: Keep your arms above your head. Hold your breath. Stay very still, even if the table is moving. When the scanning is complete, you will be moved out of the machine. The IV will be removed. The procedure may vary among health care providers and hospitals. What can I expect after the procedure? After your procedure, it is common to have: A metallic taste in your mouth from the contrast dye. A feeling of warmth. A headache from the nitroglycerin. Follow these instructions at home: Take over-the-counter and prescription medicines only as told by your health care provider. If you are told, drink enough fluid to keep your urine pale yellow. This will help to flush the contrast dye out of your body. Most people can return to their normal activities right after the procedure. Ask your health care provider what activities are safe for you. It is up to you to get the results of your procedure. Ask your health care provider, or the department that is doing the procedure, when your results will be ready. Keep all follow-up visits as told by your health care provider. This is important. Contact a health care provider if: You have any symptoms of allergy to the contrast dye. These include: Shortness of breath. Rash or hives. A racing heartbeat. Summary A cardiac CT angiogram is a procedure to look at the heart and the area around the heart. It may be done to help find the cause of chest pains or other symptoms of heart disease. During this procedure, a large X-ray machine, called a CT scanner, takes detailed pictures of the heart and the surrounding area after a contrast dye has been injected into blood vessels in the area. Ask your health care provider about changing or  stopping your regular medicines before the procedure. This is especially important if you are taking diabetes medicines, blood thinners, or medicines to treat erectile dysfunction. If you are told, drink enough fluid to keep your urine pale yellow. This will help to flush the contrast dye out of your body. This information is not intended to replace advice given to you by your health care provider. Make sure you discuss any questions you have with your health care provider. Document Revised: 11/05/2018 Document Reviewed: 11/05/2018 Elsevier Patient Education  2020 [employer].

## 2022-03-09 LAB — BASIC METABOLIC PANEL
BUN/Creatinine Ratio: 18 (ref 12–28)
BUN: 14 mg/dL (ref 8–27)
CO2: 27 mmol/L (ref 20–29)
Calcium: 9.5 mg/dL (ref 8.7–10.3)
Chloride: 105 mmol/L (ref 96–106)
Creatinine, Ser: 0.8 mg/dL (ref 0.57–1.00)
Glucose: 83 mg/dL (ref 70–99)
Potassium: 3.5 mmol/L (ref 3.5–5.2)
Sodium: 145 mmol/L — ABNORMAL HIGH (ref 134–144)
eGFR: 82 mL/min/{1.73_m2} (ref >59)

## 2022-03-14 ENCOUNTER — Telehealth (HOSPITAL_COMMUNITY): Payer: Self-pay | Admitting: *Deleted

## 2022-03-14 NOTE — Telephone Encounter (Signed)
Due to patient's history of dementia, patient's daughter was contacted regarding upcoming cardiac imaging study. Patient's daughter verbalizes understanding of appt date/time, parking situation and where to check in, pre-test NPO status and medications ordered, and verified current allergies; name and call back number provided for further questions should they arise  Larey Brick RN Navigator Cardiac Imaging [facility] [phone] office [phone] cell  Patient to take 100mg  metoprolol tartrate two hours prior to her cardiac CT scan. Her daughter is aware to that patient is to arrive at 11:30am.

## 2022-03-15 ENCOUNTER — Telehealth: Payer: Self-pay

## 2022-03-15 ENCOUNTER — Ambulatory Visit (HOSPITAL_COMMUNITY)
Admission: RE | Admit: 2022-03-15 | Discharge: 2022-03-15 | Disposition: A | Discharge status: Home / Self Care | Payer: Medicare HMO | Source: Ambulatory Visit | Attending: Cardiology | Admitting: Cardiology

## 2022-03-15 DIAGNOSIS — I209 Angina pectoris, unspecified: Secondary | ICD-10-CM | POA: Diagnosis not present

## 2022-03-15 DIAGNOSIS — E785 Hyperlipidemia, unspecified: Secondary | ICD-10-CM

## 2022-03-15 DIAGNOSIS — I259 Chronic ischemic heart disease, unspecified: Secondary | ICD-10-CM | POA: Diagnosis not present

## 2022-03-15 MED ORDER — NITROGLYCERIN 0.4 MG SL SUBL
SUBLINGUAL_TABLET | SUBLINGUAL | Status: AC
  Filled 2022-03-15: qty 2

## 2022-03-15 MED ORDER — NITROGLYCERIN 0.4 MG SL SUBL
0.8000 mg | SUBLINGUAL_TABLET | Freq: Once | SUBLINGUAL | Status: AC
  Administered 2022-03-15: 0.8 mg via SUBLINGUAL

## 2022-03-15 MED ORDER — IOHEXOL 350 MG/ML SOLN
95.0000 mL | Freq: Once | INTRAVENOUS | Status: AC | PRN
  Administered 2022-03-15: 95 mL via INTRAVENOUS

## 2022-03-15 NOTE — Telephone Encounter (Signed)
-----   Message from Garwin Brothers, MD sent at 03/15/2022  2:52 PM EST ----- Excellent results.  Mild aortic atherosclerosis.  Please get him in for a Chem-7 liver lipid check in the next few days.  Copy primary care.  Hernia issues to be managed by primary care Garwin Brothers, MD 03/15/2022 2:51 PM

## 2022-03-21 DIAGNOSIS — E785 Hyperlipidemia, unspecified: Secondary | ICD-10-CM | POA: Diagnosis not present

## 2022-03-22 LAB — COMPREHENSIVE METABOLIC PANEL
ALT: 18 IU/L (ref 0–32)
AST: 22 IU/L (ref 0–40)
Albumin/Globulin Ratio: 2.1 (ref 1.2–2.2)
Albumin: 4.1 g/dL (ref 3.9–4.9)
Alkaline Phosphatase: 69 IU/L (ref 44–121)
BUN/Creatinine Ratio: 17 (ref 12–28)
BUN: 11 mg/dL (ref 8–27)
Bilirubin Total: 1 mg/dL (ref 0.0–1.2)
CO2: 27 mmol/L (ref 20–29)
Calcium: 9.1 mg/dL (ref 8.7–10.3)
Chloride: 106 mmol/L (ref 96–106)
Creatinine, Ser: 0.64 mg/dL (ref 0.57–1.00)
Globulin, Total: 2 g/dL (ref 1.5–4.5)
Glucose: 108 mg/dL — ABNORMAL HIGH (ref 70–99)
Potassium: 3.8 mmol/L (ref 3.5–5.2)
Sodium: 143 mmol/L (ref 134–144)
Total Protein: 6.1 g/dL (ref 6.0–8.5)
eGFR: 98 mL/min/{1.73_m2} (ref >59)

## 2022-03-22 LAB — LIPID PANEL
Chol/HDL Ratio: 3 ratio (ref 0.0–4.4)
Cholesterol, Total: 183 mg/dL (ref 100–199)
HDL: 61 mg/dL (ref >39)
LDL Chol Calc (NIH): 113 mg/dL — ABNORMAL HIGH (ref 0–99)
Triglycerides: 45 mg/dL (ref 0–149)
VLDL Cholesterol Cal: 9 mg/dL (ref 5–40)

## 2022-03-28 ENCOUNTER — Telehealth: Payer: Self-pay

## 2022-03-28 DIAGNOSIS — E785 Hyperlipidemia, unspecified: Secondary | ICD-10-CM

## 2022-03-28 MED ORDER — PRAVASTATIN SODIUM 40 MG PO TABS: 40.0000 mg | ORAL_TABLET | Freq: Every day | ORAL | 3 refills | Status: AC

## 2022-03-28 NOTE — Telephone Encounter (Signed)
-----   Message from Jenean Lindau, MD sent at 03/28/2022  8:51 AM EST ----- It appears that the patient is on pravastatin.  We can double the dose and liver lipid check in 6 weeks.  Diet and exercise. ----- Message ----- From: Truddie Hidden, RN Sent: 03/27/2022   5:33 PM EST To: Jenean Lindau, MD  Mild aortic atherosclerosis. Please get him in for a Chem-7 liver lipid check in the next few days. Please advise if you are starting medication. ----- Message ----- From: Jenean Lindau, MD Sent: 03/27/2022   8:22 AM EST To: Truddie Hidden, RN  The results of the study is unremarkable. Please inform patient. I will discuss in detail at next appointment. Cc  primary care/referring physician Jenean Lindau, MD 03/27/2022 8:22 AM

## 2022-06-04 ENCOUNTER — Telehealth: Payer: Self-pay

## 2022-06-04 DIAGNOSIS — M549 Dorsalgia, unspecified: Secondary | ICD-10-CM | POA: Diagnosis not present

## 2022-06-04 DIAGNOSIS — Z6823 Body mass index (BMI) 23.0-23.9, adult: Secondary | ICD-10-CM | POA: Diagnosis not present

## 2022-06-04 DIAGNOSIS — K224 Dyskinesia of esophagus: Secondary | ICD-10-CM | POA: Diagnosis not present

## 2022-06-04 DIAGNOSIS — E7800 Pure hypercholesterolemia, unspecified: Secondary | ICD-10-CM | POA: Diagnosis not present

## 2022-06-04 DIAGNOSIS — K219 Gastro-esophageal reflux disease without esophagitis: Secondary | ICD-10-CM | POA: Diagnosis not present

## 2022-06-04 DIAGNOSIS — R079 Chest pain, unspecified: Secondary | ICD-10-CM | POA: Diagnosis not present

## 2022-06-04 NOTE — Telephone Encounter (Signed)
Received a call from the patients PCP requesting a copy of the patients last CT.   Contacted the patient and spoke with her daughter, ok per DPR, and she confirmed the patient is now seeing Dr Laqueta Due and gave the ok to fax the test.   CT repot has been faxed to PCP.   PCP updated in chart.

## 2022-06-17 ENCOUNTER — Other Ambulatory Visit: Payer: Self-pay | Admitting: Diagnostic Neuroimaging

## 2022-06-20 ENCOUNTER — Other Ambulatory Visit: Payer: Self-pay | Admitting: Cardiology

## 2022-06-20 DIAGNOSIS — I259 Chronic ischemic heart disease, unspecified: Secondary | ICD-10-CM

## 2022-07-16 DIAGNOSIS — K449 Diaphragmatic hernia without obstruction or gangrene: Secondary | ICD-10-CM | POA: Diagnosis not present

## 2022-07-16 DIAGNOSIS — Z6823 Body mass index (BMI) 23.0-23.9, adult: Secondary | ICD-10-CM | POA: Diagnosis not present

## 2022-07-16 DIAGNOSIS — F03A0 Unspecified dementia, mild, without behavioral disturbance, psychotic disturbance, mood disturbance, and anxiety: Secondary | ICD-10-CM | POA: Diagnosis not present

## 2022-07-16 DIAGNOSIS — R0789 Other chest pain: Secondary | ICD-10-CM | POA: Diagnosis not present

## 2022-07-16 DIAGNOSIS — I700 Atherosclerosis of aorta: Secondary | ICD-10-CM | POA: Diagnosis not present

## 2022-07-16 DIAGNOSIS — E7800 Pure hypercholesterolemia, unspecified: Secondary | ICD-10-CM | POA: Diagnosis not present

## 2022-07-16 DIAGNOSIS — K224 Dyskinesia of esophagus: Secondary | ICD-10-CM | POA: Diagnosis not present

## 2022-07-17 ENCOUNTER — Other Ambulatory Visit: Payer: Self-pay | Admitting: Diagnostic Neuroimaging

## 2022-07-19 DIAGNOSIS — R079 Chest pain, unspecified: Secondary | ICD-10-CM | POA: Diagnosis not present

## 2022-07-19 DIAGNOSIS — E049 Nontoxic goiter, unspecified: Secondary | ICD-10-CM | POA: Diagnosis not present

## 2022-07-19 DIAGNOSIS — E7800 Pure hypercholesterolemia, unspecified: Secondary | ICD-10-CM | POA: Diagnosis not present

## 2022-07-19 DIAGNOSIS — Z79899 Other long term (current) drug therapy: Secondary | ICD-10-CM | POA: Diagnosis not present

## 2022-08-20 DIAGNOSIS — K591 Functional diarrhea: Secondary | ICD-10-CM | POA: Diagnosis not present

## 2022-08-20 DIAGNOSIS — R112 Nausea with vomiting, unspecified: Secondary | ICD-10-CM | POA: Diagnosis not present

## 2022-08-21 ENCOUNTER — Other Ambulatory Visit: Payer: Self-pay | Admitting: Diagnostic Neuroimaging

## 2022-08-28 ENCOUNTER — Other Ambulatory Visit: Payer: Self-pay

## 2022-08-29 DIAGNOSIS — R197 Diarrhea, unspecified: Secondary | ICD-10-CM | POA: Diagnosis not present

## 2022-08-29 DIAGNOSIS — R110 Nausea: Secondary | ICD-10-CM | POA: Diagnosis not present

## 2022-08-29 DIAGNOSIS — Z6822 Body mass index (BMI) 22.0-22.9, adult: Secondary | ICD-10-CM | POA: Diagnosis not present

## 2022-09-03 DIAGNOSIS — R17 Unspecified jaundice: Secondary | ICD-10-CM | POA: Diagnosis not present

## 2022-09-07 ENCOUNTER — Ambulatory Visit: Payer: Medicare HMO | Attending: Cardiology | Admitting: Cardiology

## 2022-09-07 ENCOUNTER — Encounter: Payer: Self-pay | Admitting: Cardiology

## 2022-09-07 VITALS — BP 130/84 | HR 70 | Ht 62.0 in | Wt 121.6 lb

## 2022-09-07 DIAGNOSIS — E785 Hyperlipidemia, unspecified: Secondary | ICD-10-CM | POA: Diagnosis not present

## 2022-09-07 DIAGNOSIS — I700 Atherosclerosis of aorta: Secondary | ICD-10-CM | POA: Diagnosis not present

## 2022-09-07 DIAGNOSIS — F0150 Vascular dementia without behavioral disturbance: Secondary | ICD-10-CM | POA: Diagnosis not present

## 2022-09-07 NOTE — Patient Instructions (Signed)
Call [phone] for medication update.  Medication Instructions:  Your physician recommends that you continue on your current medications as directed. Please refer to the Current Medication list given to you today.  *If you need a refill on your cardiac medications before your next appointment, please call your pharmacy*   Lab Work: None ordered If you have labs (blood work) drawn today and your tests are completely normal, you will receive your results only by: MyChart Message (if you have MyChart) OR A paper copy in the mail If you have any lab test that is abnormal or we need to change your treatment, we will call you to review the results.   Testing/Procedures: None ordered   Follow-Up: At [facility], you and your health needs are our priority.  As part of our continuing mission to provide you with exceptional heart care, we have created designated Provider Care Teams.  These Care Teams include your primary Cardiologist (physician) and Advanced Practice Providers (APPs -  Physician Assistants and Nurse Practitioners) who all work together to provide you with the care you need, when you need it.  We recommend signing up for the patient portal called "MyChart".  Sign up information is provided on this After Visit Summary.  MyChart is used to connect with patients for Virtual Visits (Telemedicine).  Patients are able to view lab/test results, encounter notes, upcoming appointments, etc.  Non-urgent messages can be sent to your provider as well.   To learn more about what you can do with MyChart, go to [email or web address].    Your next appointment:   12 month(s)  The format for your next appointment:   In Person  Provider:   Belva Crome, MD    Other Instructions none  Important Information About Sugar

## 2022-09-07 NOTE — Progress Notes (Signed)
Cardiology Office Note:    Date:  09/07/2022   ID:  Samantha Mann, DOB 08-24-1955, MRN [id number]  PCP:  Philemon Kingdom, MD  Cardiologist:  Garwin Brothers, MD   Referring MD: Rolm Gala, NP    ASSESSMENT:    1. Dyslipidemia   2. Aortic atherosclerosis (HCC)    PLAN:    In order of problems listed above:  Aortic atherosclerosis: Secondary prevention stressed.  Importance of compliance with diet medication stressed and she vocalized understanding.  She was advised to walk at least half an hour a day 5 days a week and she promises to do so. Mixed dyslipidemia: On lipid-lowering medications followed by primary care.  Lipids were evaluated.  They were found to be elevated.  This was reviewed from cell phone that her daughter showed me.  We will get a list of medications that she takes she is not sure about statin therapy.  She is not keen on increasing it because of significant dementia issues.  I respect her wishes.  Diet and structured exercise emphasized. Dementia: This is significantly advanced per the daughter.  Followed by primary care. Patient will be seen in follow-up appointment in 6 months or earlier if the patient has any concerns.    Medication Adjustments/Labs and Tests Ordered: Current medicines are reviewed at length with the patient today.  Concerns regarding medicines are outlined above.  No orders of the defined types were placed in this encounter.  No orders of the defined types were placed in this encounter.    No chief complaint on file.    History of Present Illness:    Samantha Mann is a 67 y.o. female.  Patient was evaluated by me.  She has history of dementia.  She has dyslipidemia.  CT coronary angiography revealed calcium score of 0 and aortic atherosclerosis.  She denies any problems at this time and takes care of activities of daily living.  No chest pain orthopnea or PND.  At the time of my evaluation, the patient is alert awake oriented  and in no distress.  Her daughter accompanies her for the visit.  She mentions to me that her mother has a lot of forgetfulness especially after lunchtime every day.  Past Medical History:  Diagnosis Date   Abnormal EKG    Angina pectoris (HCC) 03/08/2022   Anxiety    Arthritis    Atypical chest pain 07/12/2015   BMI 23.0-23.9, adult    Chest pain    Dementia (HCC)    Dizziness 01/11/2011   Dyslipidemia 07/12/2015   Dyspnea on exertion 07/12/2015   GERD (gastroesophageal reflux disease)    Gilbert's disease    Goiter    H/O: pituitary tumor    High cholesterol    Kidney stones    Migraine without aura 02/15/2012   Nonspecific abnormal findings on radiological and examination of skull and head 02/15/2012   Osteoarthritis    Palpitations 07/12/2015   Visual disturbances 01/11/2011    Past Surgical History:  Procedure Laterality Date   BREAST LUMPECTOMY Right    benign   CHOLECYSTECTOMY     INGUINAL HERNIA REPAIR Right    LITHOTRIPSY     x3   thyroid nodule     excision   TOTAL ABDOMINAL HYSTERECTOMY     UMBILICAL HERNIA REPAIR     x2    Current Medications: Current Meds  Medication Sig   aspirin 81 MG EC tablet Take 81 mg by mouth daily.   Cholecalciferol  25 MCG (1000 UT) capsule Take 1,000 Units by mouth daily.   donepezil (ARICEPT) 5 MG tablet Take 5 mg by mouth at bedtime.   famotidine (PEPCID) 40 MG tablet Take 40 mg by mouth daily.   memantine (NAMENDA) 10 MG tablet TAKE 1 TABLET (10 MG TOTAL) BY MOUTH 2 (TWO) TIMES DAILY. NEEDS APPOINTMENT FOR FURTHER REFILLS   nitroGLYCERIN (NITROSTAT) 0.4 MG SL tablet Place 0.4 mg under the tongue every 5 (five) minutes as needed for chest pain.   pantoprazole (PROTONIX) 40 MG tablet Take 40 mg by mouth every morning.   PARoxetine (PAXIL) 10 MG tablet Take 10 mg by mouth daily.   pravastatin (PRAVACHOL) 40 MG tablet Take 1 tablet (40 mg total) by mouth daily.   Vitamin E 45 MG (100 UNIT) CAPS Take 100 Units by mouth daily.      Allergies:   Oxycodone-acetaminophen, Hydrocodone-acetaminophen, and Penicillins   Social History   Socioeconomic History   Marital status: Married    Spouse name: Aurther Loft   Number of children: 3   Years of education: 12   Highest education level: Not on file  Occupational History   Not on file  Tobacco Use   Smoking status: Never   Smokeless tobacco: Never  Substance and Sexual Activity   Alcohol use: Not Currently   Drug use: Never   Sexual activity: Not on file  Other Topics Concern   Not on file  Social History Narrative   06/12/21 lives with husband   Social Determinants of Health   Financial Resource Strain: Not on file  Food Insecurity: Not on file  Transportation Needs: Not on file  Physical Activity: Not on file  Stress: Not on file  Social Connections: Not on file     Family History: The patient's family history includes Cancer in her father; Lupus in her brother; Stroke in her mother.  ROS:   Please see the history of present illness.    All other systems reviewed and are negative.  EKGs/Labs/Other Studies Reviewed:    The following studies were reviewed today: CT coronary angiography revealed a calcium score of 0 and aortic atherosclerosis   Recent Labs: 03/21/2022: ALT 18; BUN 11; Creatinine, Ser 0.64; Potassium 3.8; Sodium 143  Recent Lipid Panel    Component Value Date/Time   CHOL 183 03/21/2022 0930   TRIG 45 03/21/2022 0930   HDL 61 03/21/2022 0930   CHOLHDL 3.0 03/21/2022 0930   LDLCALC 113 (H) 03/21/2022 0930    Physical Exam:    VS:  BP 130/84   Pulse 70   Ht 5\' 2"  (1.575 m)   Wt 121 lb 9.6 oz (55.2 kg)   SpO2 97%   BMI 22.24 kg/m     Wt Readings from Last 3 Encounters:  09/07/22 121 lb 9.6 oz (55.2 kg)  03/08/22 127 lb (57.6 kg)  06/12/21 128 lb 3.2 oz (58.2 kg)     GEN: Patient is in no acute distress HEENT: Normal NECK: No JVD; No carotid bruits LYMPHATICS: No lymphadenopathy CARDIAC: Hear sounds regular, 2/6  systolic murmur at the apex. RESPIRATORY:  Clear to auscultation without rales, wheezing or rhonchi  ABDOMEN: Soft, non-tender, non-distended MUSCULOSKELETAL:  No edema; No deformity  SKIN: Warm and dry NEUROLOGIC:  Alert and oriented x 3 PSYCHIATRIC:  Normal affect   Signed, Garwin Brothers, MD  09/07/2022 9:53 AM    [facility]

## 2022-11-07 DIAGNOSIS — R197 Diarrhea, unspecified: Secondary | ICD-10-CM | POA: Diagnosis not present

## 2022-11-07 DIAGNOSIS — Z79899 Other long term (current) drug therapy: Secondary | ICD-10-CM | POA: Diagnosis not present

## 2022-11-07 DIAGNOSIS — R5383 Other fatigue: Secondary | ICD-10-CM | POA: Diagnosis not present

## 2022-11-07 DIAGNOSIS — G309 Alzheimer's disease, unspecified: Secondary | ICD-10-CM | POA: Diagnosis not present

## 2022-11-07 DIAGNOSIS — R110 Nausea: Secondary | ICD-10-CM | POA: Diagnosis not present

## 2022-11-07 DIAGNOSIS — R1013 Epigastric pain: Secondary | ICD-10-CM | POA: Diagnosis not present

## 2022-11-07 DIAGNOSIS — R634 Abnormal weight loss: Secondary | ICD-10-CM | POA: Diagnosis not present

## 2022-11-07 DIAGNOSIS — E7800 Pure hypercholesterolemia, unspecified: Secondary | ICD-10-CM | POA: Diagnosis not present

## 2022-11-07 DIAGNOSIS — R17 Unspecified jaundice: Secondary | ICD-10-CM | POA: Diagnosis not present

## 2022-11-07 DIAGNOSIS — Z833 Family history of diabetes mellitus: Secondary | ICD-10-CM | POA: Diagnosis not present

## 2022-11-08 DIAGNOSIS — R17 Unspecified jaundice: Secondary | ICD-10-CM | POA: Diagnosis not present

## 2022-11-08 DIAGNOSIS — R109 Unspecified abdominal pain: Secondary | ICD-10-CM | POA: Diagnosis not present

## 2022-11-08 DIAGNOSIS — R1011 Right upper quadrant pain: Secondary | ICD-10-CM | POA: Diagnosis not present

## 2022-11-14 DIAGNOSIS — K5730 Diverticulosis of large intestine without perforation or abscess without bleeding: Secondary | ICD-10-CM | POA: Diagnosis not present

## 2022-11-14 DIAGNOSIS — R1013 Epigastric pain: Secondary | ICD-10-CM | POA: Diagnosis not present

## 2022-11-14 DIAGNOSIS — R634 Abnormal weight loss: Secondary | ICD-10-CM | POA: Diagnosis not present

## 2022-11-14 DIAGNOSIS — R109 Unspecified abdominal pain: Secondary | ICD-10-CM | POA: Diagnosis not present

## 2022-11-14 DIAGNOSIS — K449 Diaphragmatic hernia without obstruction or gangrene: Secondary | ICD-10-CM | POA: Diagnosis not present

## 2022-11-28 DIAGNOSIS — F419 Anxiety disorder, unspecified: Secondary | ICD-10-CM | POA: Diagnosis not present

## 2022-11-28 DIAGNOSIS — G309 Alzheimer's disease, unspecified: Secondary | ICD-10-CM | POA: Diagnosis not present

## 2022-11-28 DIAGNOSIS — E7800 Pure hypercholesterolemia, unspecified: Secondary | ICD-10-CM | POA: Diagnosis not present

## 2022-11-28 DIAGNOSIS — R17 Unspecified jaundice: Secondary | ICD-10-CM | POA: Diagnosis not present

## 2022-11-28 DIAGNOSIS — Z6821 Body mass index (BMI) 21.0-21.9, adult: Secondary | ICD-10-CM | POA: Diagnosis not present

## 2023-02-04 ENCOUNTER — Other Ambulatory Visit: Payer: Self-pay | Admitting: Diagnostic Neuroimaging

## 2023-03-04 DIAGNOSIS — G309 Alzheimer's disease, unspecified: Secondary | ICD-10-CM | POA: Diagnosis not present

## 2023-03-04 DIAGNOSIS — R634 Abnormal weight loss: Secondary | ICD-10-CM | POA: Diagnosis not present

## 2023-03-04 DIAGNOSIS — Z6821 Body mass index (BMI) 21.0-21.9, adult: Secondary | ICD-10-CM | POA: Diagnosis not present

## 2023-03-04 DIAGNOSIS — K219 Gastro-esophageal reflux disease without esophagitis: Secondary | ICD-10-CM | POA: Diagnosis not present

## 2023-03-04 DIAGNOSIS — F419 Anxiety disorder, unspecified: Secondary | ICD-10-CM | POA: Diagnosis not present

## 2023-03-04 DIAGNOSIS — Z0000 Encounter for general adult medical examination without abnormal findings: Secondary | ICD-10-CM | POA: Diagnosis not present

## 2023-03-04 DIAGNOSIS — Z1331 Encounter for screening for depression: Secondary | ICD-10-CM | POA: Diagnosis not present

## 2023-03-04 DIAGNOSIS — R748 Abnormal levels of other serum enzymes: Secondary | ICD-10-CM | POA: Diagnosis not present

## 2023-03-04 DIAGNOSIS — Z7189 Other specified counseling: Secondary | ICD-10-CM | POA: Diagnosis not present

## 2023-03-18 DIAGNOSIS — R051 Acute cough: Secondary | ICD-10-CM | POA: Diagnosis not present

## 2023-03-18 DIAGNOSIS — R0981 Nasal congestion: Secondary | ICD-10-CM | POA: Diagnosis not present

## 2023-03-18 DIAGNOSIS — J040 Acute laryngitis: Secondary | ICD-10-CM | POA: Diagnosis not present

## 2023-08-05 DIAGNOSIS — Z79899 Other long term (current) drug therapy: Secondary | ICD-10-CM | POA: Diagnosis not present

## 2023-08-05 DIAGNOSIS — R634 Abnormal weight loss: Secondary | ICD-10-CM | POA: Diagnosis not present

## 2023-08-05 DIAGNOSIS — Z6820 Body mass index (BMI) 20.0-20.9, adult: Secondary | ICD-10-CM | POA: Diagnosis not present

## 2023-08-05 DIAGNOSIS — K219 Gastro-esophageal reflux disease without esophagitis: Secondary | ICD-10-CM | POA: Diagnosis not present

## 2023-11-05 DIAGNOSIS — Z6821 Body mass index (BMI) 21.0-21.9, adult: Secondary | ICD-10-CM | POA: Diagnosis not present

## 2023-11-05 DIAGNOSIS — G309 Alzheimer's disease, unspecified: Secondary | ICD-10-CM | POA: Diagnosis not present

## 2023-11-05 DIAGNOSIS — K219 Gastro-esophageal reflux disease without esophagitis: Secondary | ICD-10-CM | POA: Diagnosis not present
